# Patient Record
Sex: Male | Born: 1965 | Race: White | Hispanic: No | Marital: Married | Smoking: Never smoker
Health system: Southern US, Community
[De-identification: ages and names within clinical notes are randomized; demographics above are authoritative.]

## PROBLEM LIST (undated history)

## (undated) DIAGNOSIS — Z789 Other specified health status: Secondary | ICD-10-CM

## (undated) HISTORY — PX: SHOULDER SURGERY: SHX246

## (undated) HISTORY — PX: KNEE SURGERY: SHX244

---
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
## 2000-06-14 HISTORY — PX: KNEE ARTHROSCOPY: SHX127

## 2002-06-14 HISTORY — PX: SHOULDER ARTHROSCOPY: SHX128

## 2011-10-13 HISTORY — PX: SHOULDER ARTHROSCOPY: SHX128

## 2012-05-02 DIAGNOSIS — Z79899 Other long term (current) drug therapy: Secondary | ICD-10-CM | POA: Insufficient documentation

## 2012-05-02 DIAGNOSIS — B353 Tinea pedis: Secondary | ICD-10-CM | POA: Insufficient documentation

## 2012-05-02 DIAGNOSIS — D1739 Benign lipomatous neoplasm of skin and subcutaneous tissue of other sites: Secondary | ICD-10-CM | POA: Insufficient documentation

## 2012-05-02 DIAGNOSIS — L02619 Cutaneous abscess of unspecified foot: Secondary | ICD-10-CM | POA: Insufficient documentation

## 2012-05-02 NOTE — ED Notes (Signed)
C/o left foot pain since Sunday- denies specific injury

## 2012-05-03 ENCOUNTER — Ambulatory Visit (HOSPITAL_BASED_OUTPATIENT_CLINIC_OR_DEPARTMENT_OTHER)
Admit: 2012-05-03 | Discharge: 2012-05-03 | Disposition: A | Discharge status: Home / Self Care | Payer: BC Managed Care – PPO | Attending: Emergency Medicine | Admitting: Emergency Medicine

## 2012-05-03 ENCOUNTER — Emergency Department (HOSPITAL_BASED_OUTPATIENT_CLINIC_OR_DEPARTMENT_OTHER)
Admission: EM | Admit: 2012-05-03 | Discharge: 2012-05-03 | Disposition: A | Discharge status: Home / Self Care | Payer: BC Managed Care – PPO | Attending: Emergency Medicine | Admitting: Emergency Medicine

## 2012-05-03 ENCOUNTER — Encounter (HOSPITAL_BASED_OUTPATIENT_CLINIC_OR_DEPARTMENT_OTHER): Payer: Self-pay | Admitting: *Deleted

## 2012-05-03 ENCOUNTER — Emergency Department (HOSPITAL_BASED_OUTPATIENT_CLINIC_OR_DEPARTMENT_OTHER): Payer: BC Managed Care – PPO

## 2012-05-03 DIAGNOSIS — L0390 Cellulitis, unspecified: Secondary | ICD-10-CM

## 2012-05-03 DIAGNOSIS — B353 Tinea pedis: Secondary | ICD-10-CM

## 2012-05-03 DIAGNOSIS — D179 Benign lipomatous neoplasm, unspecified: Secondary | ICD-10-CM

## 2012-05-03 LAB — BASIC METABOLIC PANEL
BUN: 13 mg/dL (ref 6–23)
Chloride: 99 mEq/L (ref 96–112)
GFR calc non Af Amer: 65 mL/min — ABNORMAL LOW (ref >90)
Glucose, Bld: 160 mg/dL — ABNORMAL HIGH (ref 70–99)
Potassium: 3.8 mEq/L (ref 3.5–5.1)

## 2012-05-03 LAB — CBC WITH DIFFERENTIAL/PLATELET
Eosinophils Absolute: 0.3 10*3/uL (ref 0.0–0.7)
HCT: 40.8 % (ref 39.0–52.0)
Hemoglobin: 14.8 g/dL (ref 13.0–17.0)
Lymphs Abs: 2.3 10*3/uL (ref 0.7–4.0)
MCH: 31.2 pg (ref 26.0–34.0)
Monocytes Relative: 12 % (ref 3–12)
Neutrophils Relative %: 66 % (ref 43–77)
RBC: 4.74 MIL/uL (ref 4.22–5.81)

## 2012-05-03 MED ORDER — TRAMADOL HCL 50 MG PO TABS: 50.0000 mg | ORAL_TABLET | Freq: Four times a day (QID) | ORAL | Status: DC | PRN

## 2012-05-03 MED ORDER — DOXYCYCLINE HYCLATE 100 MG PO TABS
100.0000 mg | ORAL_TABLET | Freq: Once | ORAL | Status: AC
  Administered 2012-05-03: 100 mg via ORAL
  Filled 2012-05-03: qty 1

## 2012-05-03 MED ORDER — TETANUS-DIPHTH-ACELL PERTUSSIS 5-2.5-18.5 LF-MCG/0.5 IM SUSP
0.5000 mL | Freq: Once | INTRAMUSCULAR | Status: AC
  Administered 2012-05-03: 0.5 mL via INTRAMUSCULAR
  Filled 2012-05-03: qty 0.5

## 2012-05-03 MED ORDER — DOXYCYCLINE HYCLATE 100 MG PO CAPS: 100.0000 mg | ORAL_CAPSULE | Freq: Two times a day (BID) | ORAL | Status: DC

## 2012-05-03 MED ORDER — TRAMADOL HCL 50 MG PO TABS
50.0000 mg | ORAL_TABLET | Freq: Once | ORAL | Status: AC
  Administered 2012-05-03: 50 mg via ORAL
  Filled 2012-05-03: qty 1

## 2012-05-03 NOTE — ED Notes (Signed)
MD at bedside. 

## 2012-05-03 NOTE — ED Notes (Signed)
MD at bedside giving test results and plan of care. 

## 2012-05-03 NOTE — ED Notes (Signed)
Opened this chart to document test result from left lower extremity venous duplex ultrasound was negative. See detailed report in epic.Radiology report finalized given to NP Teressa Lower who states to call patient on his cell as pt requested at [phone] give report that there is no evidence of DVT in the left lower extremity. Spoke with John Haney who verbalizes understanding of the test results.

## 2012-05-03 NOTE — ED Provider Notes (Signed)
History     CSN: [id number]  Arrival date & time 05/02/12  2353   First MD Initiated Contact with Patient 05/03/12 0014      Chief Complaint  Patient presents with  . Foot Pain    (Consider location/radiation/quality/duration/timing/severity/associated sxs/prior treatment) Patient is a 46 y.o. male presenting with lower extremity pain and rash. The history is provided by the patient.  Foot Pain This is a new problem. The current episode started more than 2 days ago (4 days ago). The problem has been gradually worsening. Pertinent negatives include no chest pain, no abdominal pain, no headaches and no shortness of breath. Nothing aggravates the symptoms. Nothing relieves the symptoms. He has tried nothing for the symptoms.  Rash  This is a new problem. The current episode started more than 2 days ago. The problem has been gradually worsening. The problem is associated with nothing. There has been no fever. Affected Location: left foot. The pain is moderate. The pain has been constant since onset. Associated symptoms include pain. Pertinent negatives include no weeping. He has tried nothing for the symptoms. The treatment provided no relief.  Notes redness and swelling to the left foot.    No CP no SOB no DOE.  No f/c/r.   History reviewed. No pertinent past medical history.  Past Surgical History  Procedure Date  . Shoulder surgery   . Appendectomy   . Knee surgery     No family history on file.  History  Substance Use Topics  . Smoking status: Never Smoker   . Smokeless tobacco: Current User    Types: Chew  . Alcohol Use: Yes     Comment: RARE      Review of Systems  Respiratory: Negative for shortness of breath.   Cardiovascular: Negative for chest pain and palpitations.  Gastrointestinal: Negative for abdominal pain.  Skin: Positive for rash.  Neurological: Negative for headaches.  All other systems reviewed and are negative.    Allergies  Review of  patient's allergies indicates no known allergies.  Home Medications   Current Outpatient Rx  Name  Route  Sig  Dispense  Refill  . IBUPROFEN 200 MG PO TABS   Oral   Take 400 mg by mouth every 6 (six) hours as needed.         . OXYCODONE-ACETAMINOPHEN 5-325 MG PO TABS   Oral   Take 1 tablet by mouth every 4 (four) hours as needed.           BP 146/95  Pulse 120  Temp 99.3 F (37.4 C) (Oral)  Resp 18  Ht 5\' 10"  (1.778 m)  Wt 220 lb (99.791 kg)  BMI 31.57 kg/m2  SpO2 100%  Physical Exam  Constitutional: He is oriented to person, place, and time. He appears well-developed and well-nourished. No distress.  HENT:  Head: Normocephalic and atraumatic.  Mouth/Throat: Oropharynx is clear and moist.  Eyes: Conjunctivae normal are normal. Pupils are equal, round, and reactive to light.  Neck: Normal range of motion. Neck supple.  Cardiovascular: Normal rate and regular rhythm.   Pulmonary/Chest: Effort normal and breath sounds normal. No respiratory distress. He has no wheezes. He exhibits no tenderness.  Abdominal: Soft. Bowel sounds are normal. There is no tenderness.  Musculoskeletal: Normal range of motion.       Feet:       No swelling no tenderness no cords of the calf  Neurological: He is alert and oriented to person, place, and time. He  has normal reflexes.  Skin: Skin is warm and dry.  Psychiatric: He has a normal mood and affect.    ED Course  Procedures (including critical care time)  Labs Reviewed  CBC WITH DIFFERENTIAL - Abnormal; Notable for the following:    WBC 11.6 (*)     MCHC 36.3 (*)     Monocytes Absolute 1.3 (*)     All other components within normal limits  BASIC METABOLIC PANEL - Abnormal; Notable for the following:    Glucose, Bld 160 (*)     GFR calc non Af Amer 65 (*)     GFR calc Af Amer 75 (*)     All other components within normal limits  D-DIMER, QUANTITATIVE   No results found.   No diagnosis found.    MDM  Will treat as  cellulitis follow up for recheck in 2 days sooner for fever, streaking up the leg or any concerns.  Did drive to [place] last week will set up outpatient [place] to exclude clot. Patient verbalizes understanding and agrees to follow up       Sabrina Keough Smitty Cords, MD 05/03/12 [phone]

## 2012-05-03 NOTE — ED Notes (Signed)
Patient transported to X-ray 

## 2012-12-28 ENCOUNTER — Other Ambulatory Visit: Payer: Self-pay | Admitting: Orthopedic Surgery

## 2012-12-29 ENCOUNTER — Encounter (HOSPITAL_BASED_OUTPATIENT_CLINIC_OR_DEPARTMENT_OTHER): Payer: Self-pay | Admitting: *Deleted

## 2012-12-29 NOTE — Progress Notes (Signed)
Has had several sports related injuries-surgeries-

## 2013-01-01 ENCOUNTER — Other Ambulatory Visit: Payer: Self-pay | Admitting: Orthopedic Surgery

## 2013-01-02 NOTE — H&P (Signed)
John Haney is an 47 y.o. male.   Chief Complaint: Right Knee Pain  [id number] is seen for chondromalacia patella and loose bodies on MRI scan of the right knee.  He still has intermittent catching popping and pain in the knee.  Sometimes will do well for a number of hours or days and then it'll feel like it almost completely locks up.  He had similar symptoms in the mid 1990s and did well with an arthroscopic removal of torn cartilage and loose bodies.  He does report any true locking.  Anesthetic and cortisone injection provided about 1 hours worth of pain relief.  Past Medical History  Diagnosis Date  . Medical history non-contributory     Past Surgical History  Procedure Laterality Date  . Shoulder surgery    . Knee surgery    . Shoulder arthroscopy  05,13    right  . Shoulder arthroscopy  2004    left  . Knee arthroscopy  2002    right  . Appendectomy  1984    History reviewed. No pertinent family history. Social History:  reports that he has never smoked. His smokeless tobacco use includes Chew. He reports that  drinks alcohol. He reports that he does not use illicit drugs.  Allergies: No Known Allergies  No prescriptions prior to admission    No results found for this or any previous visit (from the past 48 hour(s)). No results found.  Review of Systems  Constitutional: Negative.   HENT: Negative.   Eyes: Negative.   Respiratory: Negative.   Cardiovascular: Negative.   Gastrointestinal: Negative.   Genitourinary: Negative.   Musculoskeletal: Positive for joint pain.  Skin: Negative.   Neurological: Negative.   Endo/Heme/Allergies: Negative.   Psychiatric/Behavioral: Negative.     Height 5\' 10"  (1.778 m), weight 99.791 kg (220 lb). Physical Exam  Constitutional: He is oriented to person, place, and time. He appears well-developed and well-nourished.  HENT:  Head: Normocephalic and atraumatic.  Eyes: EOM are normal. Pupils are equal, round, and reactive  to light.  Neck: Normal range of motion.  Cardiovascular: Intact distal pulses.   Respiratory: Effort normal and breath sounds normal.  Musculoskeletal: Normal range of motion. He exhibits tenderness (right knee pain).  Neurological: He is alert and oriented to person, place, and time.  Skin: Skin is warm and dry.  Psychiatric: He has a normal mood and affect. His behavior is normal. Judgment and thought content normal.     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  Plan: He has very similar symptoms to those that he had in the 1990s and would like to proceed with arthroscopic decompression since it is worked well in the past and is failed conservative treatment with exercises and cortisone injection that provided only temporary relief.  When the anesthetic was in force.  Nestor Lewandowsky 01/02/2013, 12:48 PM

## 2013-01-03 ENCOUNTER — Encounter (HOSPITAL_BASED_OUTPATIENT_CLINIC_OR_DEPARTMENT_OTHER)
Admission: RE | Disposition: A | Discharge status: Home / Self Care | Payer: Self-pay | Source: Ambulatory Visit | Attending: Orthopedic Surgery

## 2013-01-03 ENCOUNTER — Encounter (HOSPITAL_BASED_OUTPATIENT_CLINIC_OR_DEPARTMENT_OTHER): Payer: Self-pay | Admitting: Anesthesiology

## 2013-01-03 ENCOUNTER — Ambulatory Visit (HOSPITAL_BASED_OUTPATIENT_CLINIC_OR_DEPARTMENT_OTHER): Payer: BC Managed Care – PPO | Admitting: Anesthesiology

## 2013-01-03 ENCOUNTER — Encounter (HOSPITAL_BASED_OUTPATIENT_CLINIC_OR_DEPARTMENT_OTHER): Payer: Self-pay | Admitting: *Deleted

## 2013-01-03 ENCOUNTER — Ambulatory Visit (HOSPITAL_BASED_OUTPATIENT_CLINIC_OR_DEPARTMENT_OTHER)
Admission: RE | Admit: 2013-01-03 | Discharge: 2013-01-03 | Disposition: A | Discharge status: Home / Self Care | Payer: BC Managed Care – PPO | Source: Ambulatory Visit | Attending: Orthopedic Surgery | Admitting: Orthopedic Surgery

## 2013-01-03 DIAGNOSIS — M1120 Other chondrocalcinosis, unspecified site: Secondary | ICD-10-CM | POA: Insufficient documentation

## 2013-01-03 DIAGNOSIS — M94261 Chondromalacia, right knee: Secondary | ICD-10-CM

## 2013-01-03 DIAGNOSIS — M2340 Loose body in knee, unspecified knee: Secondary | ICD-10-CM | POA: Insufficient documentation

## 2013-01-03 DIAGNOSIS — F17200 Nicotine dependence, unspecified, uncomplicated: Secondary | ICD-10-CM | POA: Insufficient documentation

## 2013-01-03 DIAGNOSIS — M2240 Chondromalacia patellae, unspecified knee: Secondary | ICD-10-CM | POA: Insufficient documentation

## 2013-01-03 HISTORY — DX: Other specified health status: Z78.9

## 2013-01-03 HISTORY — PX: KNEE ARTHROSCOPY: SHX127

## 2013-01-03 SURGERY — ARTHROSCOPY, KNEE
Anesthesia: General | Site: Knee | Laterality: Right | Wound class: Clean

## 2013-01-03 MED ORDER — MIDAZOLAM HCL 5 MG/5ML IJ SOLN
INTRAMUSCULAR | Status: DC | PRN
  Administered 2013-01-03: 2 mg via INTRAVENOUS

## 2013-01-03 MED ORDER — OXYCODONE HCL 5 MG/5ML PO SOLN: 5.0000 mg | Freq: Once | ORAL | Status: DC | PRN

## 2013-01-03 MED ORDER — CHLORHEXIDINE GLUCONATE 4 % EX LIQD: 60.0000 mL | Freq: Once | CUTANEOUS | Status: DC

## 2013-01-03 MED ORDER — CEFAZOLIN SODIUM-DEXTROSE 2-3 GM-% IV SOLR
2.0000 g | INTRAVENOUS | Status: AC
  Administered 2013-01-03: 2 g via INTRAVENOUS

## 2013-01-03 MED ORDER — FENTANYL CITRATE 0.05 MG/ML IJ SOLN
INTRAMUSCULAR | Status: DC | PRN
  Administered 2013-01-03: 100 ug via INTRAVENOUS

## 2013-01-03 MED ORDER — PROPOFOL 10 MG/ML IV BOLUS
INTRAVENOUS | Status: DC | PRN
  Administered 2013-01-03: 200 mg via INTRAVENOUS

## 2013-01-03 MED ORDER — PROMETHAZINE HCL 25 MG/ML IJ SOLN: 6.2500 mg | INTRAMUSCULAR | Status: DC | PRN

## 2013-01-03 MED ORDER — HYDROCODONE-ACETAMINOPHEN 5-325 MG PO TABS: 1.0000 | ORAL_TABLET | Freq: Four times a day (QID) | ORAL | Status: DC | PRN

## 2013-01-03 MED ORDER — LACTATED RINGERS IV SOLN
INTRAVENOUS | Status: DC
  Administered 2013-01-03: 10:00:00 via INTRAVENOUS

## 2013-01-03 MED ORDER — SODIUM CHLORIDE 0.9 % IR SOLN
Status: DC | PRN
  Administered 2013-01-03: 12:00:00

## 2013-01-03 MED ORDER — BUPIVACAINE-EPINEPHRINE 0.5% -1:200000 IJ SOLN
INTRAMUSCULAR | Status: DC | PRN
  Administered 2013-01-03: 20 mL
  Administered 2013-01-03: 10 mL

## 2013-01-03 MED ORDER — LIDOCAINE HCL (CARDIAC) 20 MG/ML IV SOLN
INTRAVENOUS | Status: DC | PRN
  Administered 2013-01-03: 100 mg via INTRAVENOUS

## 2013-01-03 MED ORDER — ONDANSETRON HCL 4 MG/2ML IJ SOLN
INTRAMUSCULAR | Status: DC | PRN
  Administered 2013-01-03: 4 mg via INTRAVENOUS

## 2013-01-03 MED ORDER — DEXAMETHASONE SODIUM PHOSPHATE 4 MG/ML IJ SOLN
INTRAMUSCULAR | Status: DC | PRN
  Administered 2013-01-03: 10 mg via INTRAVENOUS

## 2013-01-03 MED ORDER — OXYCODONE HCL 5 MG PO TABS: 5.0000 mg | ORAL_TABLET | Freq: Once | ORAL | Status: DC | PRN

## 2013-01-03 MED ORDER — POVIDONE-IODINE 7.5 % EX SOLN: Freq: Once | CUTANEOUS | Status: DC

## 2013-01-03 MED ORDER — HYDROMORPHONE HCL PF 1 MG/ML IJ SOLN
0.2500 mg | INTRAMUSCULAR | Status: DC | PRN
  Administered 2013-01-03 (×2): 0.5 mg via INTRAVENOUS

## 2013-01-03 SURGICAL SUPPLY — 38 items
BANDAGE ELASTIC 6 VELCRO ST LF (GAUZE/BANDAGES/DRESSINGS) ×2 IMPLANT
BLADE 4.2CUDA (BLADE) IMPLANT
BLADE CUTTER GATOR 3.5 (BLADE) ×2 IMPLANT
BLADE GREAT WHITE 4.2 (BLADE) IMPLANT
CANISTER OMNI JUG 16 LITER (MISCELLANEOUS) ×2 IMPLANT
CANISTER SUCTION 2500CC (MISCELLANEOUS) IMPLANT
CHLORAPREP W/TINT 26ML (MISCELLANEOUS) ×2 IMPLANT
CLOTH BEACON ORANGE TIMEOUT ST (SAFETY) ×2 IMPLANT
DRAPE ARTHROSCOPY W/POUCH 114 (DRAPES) ×2 IMPLANT
ELECT MENISCUS 165MM 90D (ELECTRODE) IMPLANT
ELECT REM PT RETURN 9FT ADLT (ELECTROSURGICAL)
ELECTRODE REM PT RTRN 9FT ADLT (ELECTROSURGICAL) IMPLANT
GAUZE XEROFORM 1X8 LF (GAUZE/BANDAGES/DRESSINGS) ×2 IMPLANT
GLOVE BIO SURGEON STRL SZ7 (GLOVE) IMPLANT
GLOVE BIO SURGEON STRL SZ7.5 (GLOVE) ×2 IMPLANT
GLOVE BIOGEL PI IND STRL 7.0 (GLOVE) ×2 IMPLANT
GLOVE BIOGEL PI IND STRL 8 (GLOVE) ×1 IMPLANT
GLOVE BIOGEL PI INDICATOR 7.0 (GLOVE) ×2
GLOVE BIOGEL PI INDICATOR 8 (GLOVE) ×1
GLOVE ECLIPSE 6.5 STRL STRAW (GLOVE) ×2 IMPLANT
GOWN PREVENTION PLUS XLARGE (GOWN DISPOSABLE) ×4 IMPLANT
IV NS IRRIG 3000ML ARTHROMATIC (IV SOLUTION) ×2 IMPLANT
KNEE WRAP E Z 3 GEL PACK (MISCELLANEOUS) ×2 IMPLANT
NDL SAFETY ECLIPSE 18X1.5 (NEEDLE) ×1 IMPLANT
NEEDLE HYPO 18GX1.5 SHARP (NEEDLE) ×1
PACK ARTHROSCOPY DSU (CUSTOM PROCEDURE TRAY) ×2 IMPLANT
PACK BASIN DAY SURGERY FS (CUSTOM PROCEDURE TRAY) ×2 IMPLANT
PAD ALCOHOL SWAB (MISCELLANEOUS) ×2 IMPLANT
PENCIL BUTTON HOLSTER BLD 10FT (ELECTRODE) IMPLANT
SET ARTHROSCOPY TUBING (MISCELLANEOUS) ×1
SET ARTHROSCOPY TUBING LN (MISCELLANEOUS) ×1 IMPLANT
SLEEVE SCD COMPRESS KNEE MED (MISCELLANEOUS) IMPLANT
SPONGE GAUZE 4X4 12PLY (GAUZE/BANDAGES/DRESSINGS) ×2 IMPLANT
SYR 3ML 18GX1 1/2 (SYRINGE) IMPLANT
SYR 5ML LL (SYRINGE) ×2 IMPLANT
TOWEL OR 17X24 6PK STRL BLUE (TOWEL DISPOSABLE) ×2 IMPLANT
WAND STAR VAC 90 (SURGICAL WAND) IMPLANT
WATER STERILE IRR 1000ML POUR (IV SOLUTION) ×2 IMPLANT

## 2013-01-03 NOTE — Transfer of Care (Signed)
Immediate Anesthesia Transfer of Care Note  Patient: John Haney  Procedure(s) Performed: Procedure(s): ARTHROSCOPY KNEE, CHONDROPLASTY AND EXCISION OF LOOSE BODY (Right)  Patient Location: PACU  Anesthesia Type:General  Level of Consciousness: awake and alert   Airway & Oxygen Therapy: Patient Spontanous Breathing and Patient connected to face mask oxygen  Post-op Assessment: Report given to PACU RN and Post -op Vital signs reviewed and stable  Post vital signs: Reviewed and stable  Complications: No apparent anesthesia complications

## 2013-01-03 NOTE — Interval H&P Note (Signed)
History and Physical Interval Note:  01/03/2013 11:41 AM  John Haney  has presented today for surgery, with the diagnosis of chondromalacia patella, loose body  The various methods of treatment have been discussed with the patient and family. After consideration of risks, benefits and other options for treatment, the patient has consented to  Procedure(s): ARTHROSCOPY KNEE (Right) as a surgical intervention .  The patient's history has been reviewed, patient examined, no change in status, stable for surgery.  I have reviewed the patient's chart and labs.  Questions were answered to the patient's satisfaction.     Nestor Lewandowsky

## 2013-01-03 NOTE — Anesthesia Procedure Notes (Signed)
Procedure Name: LMA Insertion Date/Time: 01/03/2013 11:52 AM Performed by: Caren Macadam Pre-anesthesia Checklist: Patient identified, Emergency Drugs available, Suction available and Patient being monitored Patient Re-evaluated:Patient Re-evaluated prior to inductionOxygen Delivery Method: Circle System Utilized Preoxygenation: Pre-oxygenation with 100% oxygen Intubation Type: IV induction Ventilation: Mask ventilation without difficulty LMA: LMA inserted LMA Size: 5.0 Number of attempts: 1 Airway Equipment and Method: bite block Placement Confirmation: positive ETCO2 and breath sounds checked- equal and bilateral Tube secured with: Tape Dental Injury: Teeth and Oropharynx as per pre-operative assessment

## 2013-01-03 NOTE — Op Note (Signed)
Pre-Op Dx: Right knee loose bodies and chondromalacia  Postop Dx: Right knee chondral loose bodies, grade 3 chondromalacia lateral tibial plateau and apex of patella with an impressive chondrocalcinosis.   Procedure: Arthroscopic removal of loose bodies, debridement chondromalacia grade 3 to the apex of the patella and lateral tibial plateau with removal of extensive chondrocalcinosis  Surgeon: Feliberto Gottron. Turner Daniels M.D.  Assist: Cheryln Manly [employer] student Anes: General LMA  EBL: Minimal  Fluids: 800 cc   Indications: Patient has catching popping and pain in his right knee for many weeks appear x-rays are consistent with loose bodies in the joint.. Pt has failed conservative treatment with anti-inflammatory medicines, physical therapy, and modified activites but did get good temporarily from an intra-articular cortisone injection. Pain has recurred and patient desires elective arthroscopic evaluation and treatment of knee. Risks and benefits of surgery have been discussed and questions answered.  Procedure: Patient identified by arm band and taken to the operating room at the day [facility]. The appropriate anesthetic monitors were attached, and General LMA anesthesia was induced without difficulty. Lateral post was applied to the table and the lower extremity was prepped and draped in usual sterile fashion from the ankle to the midthigh. Time out procedure was performed. We began the operation by making standard inferior lateral and inferior medial peripatellar portals with a #11 blade allowing introduction of the arthroscope through the inferior lateral portal and the out flow to the inferior medial portal. Pump pressure was set at 100 mmHg and diagnostic arthroscopy  revealed extensive chondrocalcinosis of the patellofemoral joint medial and lateral compartments as well as the notch region. This was extensively debrided with a 3.5 mm Gator sucker shaver. Grade 3 chondromalacia was noted to the apex of  the patella and lateral tibial plateau region. While on the lateral side we identified removed 2 chondral loose bodies one of them a centimeter in diameter. The knee was irrigated out normal saline solution. A dressing of xerofoam 4 x 4 dressing sponges, web roll and an Ace wrap was applied. The patient was awakened extubated and taken to the recovery without difficulty.    Signed: Nestor Lewandowsky, MD

## 2013-01-03 NOTE — Anesthesia Preprocedure Evaluation (Signed)
Anesthesia Evaluation  Patient identified by MRN, date of birth, ID band Patient awake    Reviewed: Allergy & Precautions, H&P , NPO status , Patient's Chart, lab work & pertinent test results  History of Anesthesia Complications Negative for: history of anesthetic complications  Airway Mallampati: I  Neck ROM: Full    Dental  (+) Teeth Intact   Pulmonary neg pulmonary ROS,  breath sounds clear to auscultation        Cardiovascular negative cardio ROS  Rhythm:Regular Rate:Normal     Neuro/Psych negative neurological ROS     GI/Hepatic Neg liver ROS,   Endo/Other    Renal/GU negative Renal ROS     Musculoskeletal   Abdominal   Peds  Hematology   Anesthesia Other Findings   Reproductive/Obstetrics                           Anesthesia Physical Anesthesia Plan  ASA: I  Anesthesia Plan: General   Post-op Pain Management:    Induction: Intravenous  Airway Management Planned: LMA  Additional Equipment:   Intra-op Plan:   Post-operative Plan: Extubation in OR  Informed Consent: I have reviewed the patients History and Physical, chart, labs and discussed the procedure including the risks, benefits and alternatives for the proposed anesthesia with the patient or authorized representative who has indicated his/her understanding and acceptance.   Dental advisory given  Plan Discussed with: CRNA and Surgeon  Anesthesia Plan Comments:         Anesthesia Quick Evaluation

## 2013-01-03 NOTE — Anesthesia Postprocedure Evaluation (Signed)
  Anesthesia Post-op Note  Patient: John Haney  Procedure(s) Performed: Procedure(s): ARTHROSCOPY KNEE, CHONDROPLASTY AND EXCISION OF LOOSE BODY (Right)  Patient Location: PACU  Anesthesia Type:General  Level of Consciousness: awake and alert   Airway and Oxygen Therapy: Patient Spontanous Breathing  Post-op Pain: mild  Post-op Assessment: Post-op Vital signs reviewed  Post-op Vital Signs: stable  Complications: No apparent anesthesia complications

## 2013-01-04 ENCOUNTER — Encounter (HOSPITAL_BASED_OUTPATIENT_CLINIC_OR_DEPARTMENT_OTHER): Payer: Self-pay | Admitting: Orthopedic Surgery

## 2014-05-27 ENCOUNTER — Emergency Department (HOSPITAL_BASED_OUTPATIENT_CLINIC_OR_DEPARTMENT_OTHER): Payer: No Typology Code available for payment source

## 2014-05-27 ENCOUNTER — Emergency Department (HOSPITAL_BASED_OUTPATIENT_CLINIC_OR_DEPARTMENT_OTHER)
Admission: EM | Admit: 2014-05-27 | Discharge: 2014-05-27 | Disposition: A | Discharge status: Home / Self Care | Payer: No Typology Code available for payment source | Attending: Emergency Medicine | Admitting: Emergency Medicine

## 2014-05-27 ENCOUNTER — Encounter (HOSPITAL_BASED_OUTPATIENT_CLINIC_OR_DEPARTMENT_OTHER): Payer: Self-pay

## 2014-05-27 DIAGNOSIS — M79672 Pain in left foot: Secondary | ICD-10-CM | POA: Insufficient documentation

## 2014-05-27 LAB — URIC ACID: URIC ACID, SERUM: 9.7 mg/dL — AB (ref 4.0–7.8)

## 2014-05-27 MED ORDER — INDOMETHACIN 25 MG PO CAPS: 25.0000 mg | ORAL_CAPSULE | Freq: Three times a day (TID) | ORAL | Status: AC

## 2014-05-27 MED ORDER — HYDROCODONE-ACETAMINOPHEN 5-325 MG PO TABS: 1.0000 | ORAL_TABLET | Freq: Four times a day (QID) | ORAL | Status: DC | PRN

## 2014-05-27 NOTE — Discharge Instructions (Signed)
You may call back for your uric acid level as it has not resulted yet. Please follow up with your primary care physician in 1-2 days. If you do not have one please call the [facility] number listed above. Please take pain medication and/or muscle relaxants as prescribed and as needed for pain. Please do not drive on narcotic pain medication or on muscle relaxants. Please read all discharge instructions and return precautions.   Gout Gout is an inflammatory arthritis caused by a buildup of uric acid crystals in the joints. Uric acid is a chemical that is normally present in the blood. When the level of uric acid in the blood is too high it can form crystals that deposit in your joints and tissues. This causes joint redness, soreness, and swelling (inflammation). Repeat attacks are common. Over time, uric acid crystals can form into masses (tophi) near a joint, destroying bone and causing disfigurement. Gout is treatable and often preventable. CAUSES  The disease begins with elevated levels of uric acid in the blood. Uric acid is produced by your body when it breaks down a naturally found substance called purines. Certain foods you eat, such as meats and fish, contain high amounts of purines. Causes of an elevated uric acid level include:  Being passed down from parent to child (heredity).  Diseases that cause increased uric acid production (such as obesity, psoriasis, and certain cancers).  Excessive alcohol use.  Diet, especially diets rich in meat and seafood.  Medicines, including certain cancer-fighting medicines (chemotherapy), water pills (diuretics), and aspirin.  Chronic kidney disease. The kidneys are no longer able to remove uric acid well.  Problems with metabolism. Conditions strongly associated with gout include:  Obesity.  High blood pressure.  High cholesterol.  Diabetes. Not everyone with elevated uric acid levels gets gout. It is not understood why  some people get gout and others do not. Surgery, joint injury, and eating too much of certain foods are some of the factors that can lead to gout attacks. SYMPTOMS   An attack of gout comes on quickly. It causes intense pain with redness, swelling, and warmth in a joint.  Fever can occur.  Often, only one joint is involved. Certain joints are more commonly involved:  Base of the big toe.  Knee.  Ankle.  Wrist.  Finger. Without treatment, an attack usually goes away in a few days to weeks. Between attacks, you usually will not have symptoms, which is different from many other forms of arthritis. DIAGNOSIS  Your caregiver will suspect gout based on your symptoms and exam. In some cases, tests may be recommended. The tests may include:  Blood tests.  Urine tests.  X-rays.  Joint fluid exam. This exam requires a needle to remove fluid from the joint (arthrocentesis). Using a microscope, gout is confirmed when uric acid crystals are seen in the joint fluid. TREATMENT  There are two phases to gout treatment: treating the sudden onset (acute) attack and preventing attacks (prophylaxis).  Treatment of an Acute Attack.  Medicines are used. These include anti-inflammatory medicines or steroid medicines.  An injection of steroid medicine into the affected joint is sometimes necessary.  The painful joint is rested. Movement can worsen the arthritis.  You may use warm or cold treatments on painful joints, depending which works best for you.  Treatment to Prevent Attacks.  If you suffer from frequent gout attacks, your caregiver may advise preventive medicine. These medicines are started after the acute attack  subsides. These medicines either help your kidneys eliminate uric acid from your body or decrease your uric acid production. You may need to stay on these medicines for a very long time.  The early phase of treatment with preventive medicine can be associated with an increase in  acute gout attacks. For this reason, during the first few months of treatment, your caregiver may also advise you to take medicines usually used for acute gout treatment. Be sure you understand your caregiver's directions. Your caregiver may make several adjustments to your medicine dose before these medicines are effective.  Discuss dietary treatment with your caregiver or dietitian. Alcohol and drinks high in sugar and fructose and foods such as meat, poultry, and seafood can increase uric acid levels. Your caregiver or dietitian can advise you on drinks and foods that should be limited. HOME CARE INSTRUCTIONS   Do not take aspirin to relieve pain. This raises uric acid levels.  Only take over-the-counter or prescription medicines for pain, discomfort, or fever as directed by your caregiver.  Rest the joint as much as possible. When in bed, keep sheets and blankets off painful areas.  Keep the affected joint raised (elevated).  Apply warm or cold treatments to painful joints. Use of warm or cold treatments depends on which works best for you.  Use crutches if the painful joint is in your leg.  Drink enough fluids to keep your urine clear or pale yellow. This helps your body get rid of uric acid. Limit alcohol, sugary drinks, and fructose drinks.  Follow your dietary instructions. Pay careful attention to the amount of protein you eat. Your daily diet should emphasize fruits, vegetables, whole grains, and fat-free or low-fat milk products. Discuss the use of coffee, vitamin C, and cherries with your caregiver or dietitian. These may be helpful in lowering uric acid levels.  Maintain a healthy body weight. SEEK MEDICAL CARE IF:   You develop diarrhea, vomiting, or any side effects from medicines.  You do not feel better in 24 hours, or you are getting worse. SEEK IMMEDIATE MEDICAL CARE IF:   Your joint becomes suddenly more tender, and you have chills or a fever. MAKE SURE YOU:    Understand these instructions.  Will watch your condition.  Will get help right away if you are not doing well or get worse. Document Released: 05/28/2000 Document Revised: 10/15/2013 Document Reviewed: 01/12/2012 [facility] Patient Information 2015 [place], [place]. This information is not intended to replace advice given to you by your health care provider. Make sure you discuss any questions you have with your health care provider.

## 2014-05-27 NOTE — ED Provider Notes (Signed)
CSN: [id number]     Arrival date & time 05/27/14  1221 History   First MD Initiated Contact with Patient 05/27/14 1310     Chief Complaint  Patient presents with  . Foot Pain     (Consider location/radiation/quality/duration/timing/severity/associated sxs/prior Treatment) HPI Comments: Patient is a 48 yo M presenting to the ED for left foot pain and redness that started two days. He states his pain has been gradually worsening since then. He noticed the redness yesterday. Patient endorses having flare ups for these symptoms in the past, but no one has ever told him if he has gout or not. He does notice his symptoms typically start after having very salty foods. He has tried Naproxen with no improvement. No modifying factors identified. Denies any fevers, chills, numbness or weakness.   Past Medical History  Diagnosis Date  . Medical history non-contributory    Past Surgical History  Procedure Laterality Date  . Shoulder surgery    . Knee surgery    . Shoulder arthroscopy  05,13    right  . Shoulder arthroscopy  2004    left  . Knee arthroscopy  2002    right  . Appendectomy  1984  . Knee arthroscopy Right 01/03/2013    Procedure: ARTHROSCOPY KNEE, CHONDROPLASTY AND EXCISION OF LOOSE BODY;  Surgeon: Kerin Salen, MD;  Location: [facility];  Service: Orthopedics;  Laterality: Right;   No family history on file. History  Substance Use Topics  . Smoking status: Never Smoker   . Smokeless tobacco: Current User    Types: Chew  . Alcohol Use: Yes     Comment: RARE    Review of Systems  Musculoskeletal: Positive for myalgias and arthralgias.  All other systems reviewed and are negative.     Allergies  Review of patient's allergies indicates no known allergies.  Home Medications   Prior to Admission medications   Medication Sig Start Date End Date Taking? Authorizing Provider  HYDROcodone-acetaminophen (NORCO) 5-325 MG per tablet Take 1 tablet by mouth  every 6 (six) hours as needed for pain. 01/03/13   Kerin Salen, MD  ibuprofen (ADVIL,MOTRIN) 200 MG tablet Take 400 mg by mouth every 6 (six) hours as needed.    Historical Provider, MD   BP 150/97 mmHg  Pulse 82  Temp(Src) 97.9 F (36.6 C) (Oral)  Resp 18  Ht 5\' 10"  (1.778 m)  Wt 225 lb (102.059 kg)  BMI 32.28 kg/m2  SpO2 99% Physical Exam  Constitutional: He is oriented to person, place, and time. He appears well-developed and well-nourished. No distress.  HENT:  Head: Normocephalic and atraumatic.  Right Ear: External ear normal.  Left Ear: External ear normal.  Nose: Nose normal.  Mouth/Throat: Oropharynx is clear and moist.  Eyes: Conjunctivae are normal.  Neck: Normal range of motion. Neck supple.  Cardiovascular: Normal rate, regular rhythm, normal heart sounds and intact distal pulses.   Pulmonary/Chest: Effort normal and breath sounds normal. No respiratory distress.  Abdominal: Soft.  Musculoskeletal: Normal range of motion.       Right ankle: Normal.       Left ankle: Normal.       Right foot: Normal.       Left foot: There is tenderness. There is normal range of motion, no swelling, normal capillary refill, no crepitus, no deformity and no laceration.       Feet:  Neurological: He is alert and oriented to person, place, and time.  Skin:  Skin is warm and dry. He is not diaphoretic.  Psychiatric: He has a normal mood and affect.  Nursing note and vitals reviewed.   ED Course  Procedures (including critical care time) Medications - No data to display  Labs Review Labs Reviewed  URIC ACID    Imaging Review Dg Foot Complete Left  05/27/2014   CLINICAL DATA:  Left foot swelling and redness.  EXAM: LEFT FOOT - COMPLETE 3+ VIEW  COMPARISON:  None.  FINDINGS: There is no evidence of fracture or dislocation. 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 Soft tissues are unremarkable.  IMPRESSION: No acute osseous injury of the left foot.   Electronically  Signed   By: Kathreen Devoid   On: 05/27/2014 14:06     EKG Interpretation None      Patient requesting to be discharged prior to uric acid result. Advised him he could call for results.  MDM   Final diagnoses:  Left foot pain    Filed Vitals:   05/27/14 1228  BP: 150/97  Pulse:   Temp:   Resp:    Afebrile, NAD, non-toxic appearing, AAOx4. I have reviewed nursing notes, vital signs, and all appropriate lab and imaging results for this patient. Neurovascularly intact. Normal sensation. No evidence of compartment syndrome. No evidence of fracture or other acute bony abnormality. Will treat for gout. Uric acid elevated will treat with indomethacin and pain medications. Advised PCP f/u. Return precautions discussed. Patient is agreeable to plan. Patient is stable at time of discharge      Harlow Mares, PA-C 05/27/14 [place], MD 05/28/14 [phone]

## 2014-05-27 NOTE — ED Notes (Signed)
Pt reports left foot pain and swelling that started 2 days ago.  Pt reports it happens every now adn then and they have never determined if it was gout or arthritis.  Pt reports pain with walking.

## 2014-08-18 ENCOUNTER — Emergency Department (HOSPITAL_COMMUNITY): Payer: BLUE CROSS/BLUE SHIELD

## 2014-08-18 ENCOUNTER — Emergency Department (HOSPITAL_COMMUNITY)
Admission: EM | Admit: 2014-08-18 | Discharge: 2014-08-18 | Disposition: A | Discharge status: Home / Self Care | Payer: BLUE CROSS/BLUE SHIELD | Attending: Emergency Medicine | Admitting: Emergency Medicine

## 2014-08-18 ENCOUNTER — Encounter (HOSPITAL_COMMUNITY): Payer: Self-pay | Admitting: *Deleted

## 2014-08-18 DIAGNOSIS — Y9372 Activity, wrestling: Secondary | ICD-10-CM | POA: Diagnosis not present

## 2014-08-18 DIAGNOSIS — S4991XA Unspecified injury of right shoulder and upper arm, initial encounter: Secondary | ICD-10-CM | POA: Diagnosis not present

## 2014-08-18 DIAGNOSIS — Y9289 Other specified places as the place of occurrence of the external cause: Secondary | ICD-10-CM | POA: Insufficient documentation

## 2014-08-18 DIAGNOSIS — T1490XA Injury, unspecified, initial encounter: Secondary | ICD-10-CM

## 2014-08-18 DIAGNOSIS — Y998 Other external cause status: Secondary | ICD-10-CM | POA: Diagnosis not present

## 2014-08-18 DIAGNOSIS — X58XXXA Exposure to other specified factors, initial encounter: Secondary | ICD-10-CM | POA: Insufficient documentation

## 2014-08-18 DIAGNOSIS — S59901A Unspecified injury of right elbow, initial encounter: Secondary | ICD-10-CM | POA: Diagnosis not present

## 2014-08-18 MED ORDER — METHOCARBAMOL 500 MG PO TABS: 500.0000 mg | ORAL_TABLET | Freq: Two times a day (BID) | ORAL | Status: DC

## 2014-08-18 MED ORDER — IBUPROFEN 800 MG PO TABS: 800.0000 mg | ORAL_TABLET | Freq: Three times a day (TID) | ORAL | Status: DC | PRN

## 2014-08-18 NOTE — ED Notes (Signed)
Pt reports wrestling on Thursday and felt something pop to right arm, has pain to elbow and into his shoulder.

## 2014-08-18 NOTE — ED Provider Notes (Signed)
CSN: [id number]     Arrival date & time 08/18/14  0828 History   First MD Initiated Contact with Patient 08/18/14 [phone]     Chief Complaint  Patient presents with  . Arm Injury     (Consider location/radiation/quality/duration/timing/severity/associated sxs/prior Treatment) HPI   49 year old male presents for evaluation of right elbow injury. 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  Sts he immediately stop wrestling and applied ice to affected area.  He has been taking aleve for pain with some improvement.  Report a pulling sensation to the anterior aspect of R shoulder with certain movement, as well as pain to the medial aspect of R elbow with supination.  Normal grip strength, no bruising or swelling.  Pt has multiple surgeries to R shoulder in the past.  He f/u with [facility].  Denies numbness or weakness.  No neck pain.   Past Medical History  Diagnosis Date  . Medical history non-contributory    Past Surgical History  Procedure Laterality Date  . Shoulder surgery    . Knee surgery    . Shoulder arthroscopy  05,13    right  . Shoulder arthroscopy  2004    left  . Knee arthroscopy  2002    right  . Appendectomy  1984  . Knee arthroscopy Right 01/03/2013    Procedure: ARTHROSCOPY KNEE, CHONDROPLASTY AND EXCISION OF LOOSE BODY;  Surgeon: Kerin Salen, MD;  Location: [facility];  Service: Orthopedics;  Laterality: Right;   History reviewed. No pertinent family history. History  Substance Use Topics  . Smoking status: Never Smoker   . Smokeless tobacco: Current User    Types: Chew  . Alcohol Use: Yes     Comment: RARE    Review of Systems  Constitutional: Negative for fever.  Musculoskeletal: Positive for arthralgias.  Skin: Negative for rash and wound.  Neurological: Negative for numbness.       Allergies  Review of patient's allergies indicates no known allergies.  Home Medications   Prior to Admission medications   Medication Sig Start Date End Date Taking? Authorizing Provider  HYDROcodone-acetaminophen (NORCO) 5-325 MG per tablet Take 1 tablet by mouth every 6 (six) hours as needed for pain. 01/03/13   Kerin Salen, MD  HYDROcodone-acetaminophen (NORCO/VICODIN) 5-325 MG per tablet Take 1-2 tablets by mouth every 6 (six) hours as needed for severe pain. 05/27/14   Jennifer L Piepenbrink, PA-C  ibuprofen (ADVIL,MOTRIN) 200 MG tablet Take 400 mg by mouth every 6 (six) hours as needed.    Historical Provider, MD  indomethacin (INDOCIN) 25 MG capsule Take 1 capsule (25 mg total) by mouth 3 (three) times daily with meals. May take up to 50mg  three times a day if no improvement with 25mg . 05/27/14   Jennifer L Piepenbrink, PA-C   BP 142/88 mmHg  Pulse 100  Temp(Src) 97.8 F (36.6 C) (Oral)  Resp 20  SpO2 99% Physical Exam  Constitutional: He appears well-developed and well-nourished. No distress.  HENT:  Head: Atraumatic.  Eyes: Conjunctivae are normal.  Neck: Normal range of motion. Neck supple.  Musculoskeletal: He exhibits tenderness (R shoulder: tenderness to [facility] joint with FROM, no gross deformity and no overlying skin changes.  R elbow: point tenederness to medial epicondyle, pain with supination.  FROM.).  Radial pulses intact with normal grip  strength bilaterally.   Neurological: He is alert.  Skin: No rash noted.  Psychiatric: He has a normal mood and affect.    ED Course  Procedures (including critical care time)  8:50 AM Injury to R elbow, suspect tendon/ligament injury.  Xray ordered.   10:02 AM X-rays show no acute fractures or dislocation. Suspect soft tissue injury. A sling provided for comfort. Ice therapy discussed. Patient will follow-up closely with his orthopedic specialist, Dr. Mayer Camel for outpatient management. He may benefit from MRI if  symptoms persist. Otherwise he is neurovascularly intact.  Labs Review Labs Reviewed - No data to display  Imaging Review Dg Elbow Complete Right  08/18/2014   CLINICAL DATA:  49 year old male with a history of right elbow injury, sports injury.  EXAM: RIGHT ELBOW - COMPLETE 3+ VIEW  COMPARISON:  None.  FINDINGS: No acute bony abnormality. No significant soft tissue swelling. No evidence of joint effusion.  No radiopaque foreign body.  No significant degenerative changes.  IMPRESSION: No acute bony abnormality identified. If there is ongoing concern for occult bony abnormality, repeat plain film in 10-14 days may be useful. Alternatively, if there is concern for soft tissue injury, referral for evaluation and potential MRI may be indicated.  Signed,  Dulcy Fanny. Earleen Newport, DO  [facility]  [facility]   Electronically Signed   By: Corrie Mckusick D.O.   On: 08/18/2014 09:55     EKG Interpretation None      MDM   Final diagnoses:  Injury  Elbow injury, right, initial encounter    BP 142/88 mmHg  Pulse 100  Temp(Src) 97.8 F (36.6 C) (Oral)  Resp 20  SpO2 99%  I have reviewed nursing notes and vital signs. I personally reviewed the imaging tests through PACS system  I reviewed available ER/hospitalization records thought the EMR     Domenic Moras, PA-C 08/18/14 [place], MD 08/21/14 2220

## 2014-08-18 NOTE — Discharge Instructions (Signed)
RICE: Routine Care for Injuries The routine care of many injuries includes Rest, Ice, Compression, and Elevation (RICE). HOME CARE INSTRUCTIONS  Rest is needed to allow your body to heal. Routine activities can usually be resumed when comfortable. Injured tendons and bones can take up to 6 weeks to heal. Tendons are the cord-like structures that attach muscle to bone.  Ice following an injury helps keep the swelling down and reduces pain.  Put ice in a plastic bag.  Place a towel between your skin and the bag.  Leave the ice on for 15-20 minutes, 3-4 times a day, or as directed by your health care provider. Do this while awake, for the first 24 to 48 hours. After that, continue as directed by your caregiver.  Compression helps keep swelling down. It also gives support and helps with discomfort. If an elastic bandage has been applied, it should be removed and reapplied every 3 to 4 hours. It should not be applied tightly, but firmly enough to keep swelling down. Watch fingers or toes for swelling, bluish discoloration, coldness, numbness, or excessive pain. If any of these problems occur, remove the bandage and reapply loosely. Contact your caregiver if these problems continue.  Elevation helps reduce swelling and decreases pain. With extremities, such as the arms, hands, legs, and feet, the injured area should be placed near or above the level of the heart, if possible. SEEK IMMEDIATE MEDICAL CARE IF:  You have persistent pain and swelling.  You develop redness, numbness, or unexpected weakness.  Your symptoms are getting worse rather than improving after several days. These symptoms may indicate that further evaluation or further X-rays are needed. Sometimes, X-rays may not show a small broken bone (fracture) until 1 week or 10 days later. Make a follow-up appointment with your caregiver. Ask when your X-ray results will be ready. Make sure you get your X-ray results. Document Released:  09/12/2000 Document Revised: 06/05/2013 Document Reviewed: 10/30/2010 Exit[employer] Patient Information 2015 [employer]. This information is not intended to replace advice given to you by your health care provider. Make sure you discuss any questions you have with your health care provider.  

## 2014-08-18 NOTE — ED Notes (Signed)
Declined W/C at D/C and was escorted to lobby by RN. 

## 2015-03-13 ENCOUNTER — Emergency Department (HOSPITAL_COMMUNITY): Payer: BLUE CROSS/BLUE SHIELD

## 2015-03-13 ENCOUNTER — Emergency Department (HOSPITAL_COMMUNITY)
Admission: EM | Admit: 2015-03-13 | Discharge: 2015-03-13 | Disposition: A | Discharge status: Home / Self Care | Payer: BLUE CROSS/BLUE SHIELD | Attending: Emergency Medicine | Admitting: Emergency Medicine

## 2015-03-13 ENCOUNTER — Emergency Department (HOSPITAL_COMMUNITY): Payer: Self-pay

## 2015-03-13 ENCOUNTER — Encounter (HOSPITAL_COMMUNITY): Payer: Self-pay | Admitting: Emergency Medicine

## 2015-03-13 DIAGNOSIS — Y998 Other external cause status: Secondary | ICD-10-CM | POA: Insufficient documentation

## 2015-03-13 DIAGNOSIS — Z79899 Other long term (current) drug therapy: Secondary | ICD-10-CM | POA: Insufficient documentation

## 2015-03-13 DIAGNOSIS — Y9389 Activity, other specified: Secondary | ICD-10-CM | POA: Insufficient documentation

## 2015-03-13 DIAGNOSIS — Y9289 Other specified places as the place of occurrence of the external cause: Secondary | ICD-10-CM | POA: Insufficient documentation

## 2015-03-13 DIAGNOSIS — X58XXXA Exposure to other specified factors, initial encounter: Secondary | ICD-10-CM | POA: Insufficient documentation

## 2015-03-13 DIAGNOSIS — S93401A Sprain of unspecified ligament of right ankle, initial encounter: Secondary | ICD-10-CM | POA: Insufficient documentation

## 2015-03-13 MED ORDER — IBUPROFEN 800 MG PO TABS: 800.0000 mg | ORAL_TABLET | Freq: Three times a day (TID) | ORAL | Status: AC

## 2015-03-13 MED ORDER — IBUPROFEN 400 MG PO TABS
600.0000 mg | ORAL_TABLET | Freq: Once | ORAL | Status: AC
  Administered 2015-03-13: 600 mg via ORAL
  Filled 2015-03-13: qty 2

## 2015-03-13 NOTE — Discharge Instructions (Signed)

## 2015-03-13 NOTE — ED Notes (Signed)
Pt states he "rolled" his right ankle 2 days ago. No deformity. States has been icing and elevating. Has ASO that "worked the best". 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

## 2015-03-13 NOTE — ED Provider Notes (Signed)
CSN: [id number]     Arrival date & time 03/13/15  9485 History  By signing my name below, I, John Haney, attest that this documentation has been prepared under the direction and in the presence of [employer], PA-C. Electronically Signed: Judithann Haney, ED Scribe. 03/13/2015. 10:50 AM.   Chief Complaint  Patient presents with  . Ankle Injury   The history is provided by the patient. No language interpreter was used.   HPI Comments: John Haney is a 49 y.o. male who presents to the Emergency Department status post ankle injury that occurred 2 days ago. He reports associated gradually worsening right ankle pain and swelling after putting on a boot. He explains that he was walking with his daughter when a brick came out causing him to invert his ankle. He states that he has been using ice and keeping his foot elevated with no relief. He reports that he has sprained this ankle before but never broken it. Patient denies any pain to the knee or hip.  Past Medical History  Diagnosis Date  . Medical history non-contributory    Past Surgical History  Procedure Laterality Date  . Shoulder surgery    . Knee surgery    . Shoulder arthroscopy  05,13    right  . Shoulder arthroscopy  2004    left  . Knee arthroscopy  2002    right  . Appendectomy  1984  . Knee arthroscopy Right 01/03/2013    Procedure: ARTHROSCOPY KNEE, CHONDROPLASTY AND EXCISION OF LOOSE BODY;  Surgeon: John Salen, MD;  Location: [facility];  Service: Orthopedics;  Laterality: Right;   No family history on file. Social History  Substance Use Topics  . Smoking status: Never Smoker   . Smokeless tobacco: Current User    Types: Chew  . Alcohol Use: Yes     Comment: RARE    Review of Systems  Constitutional: Negative for fever.  Musculoskeletal: Positive for joint swelling and arthralgias.  All other systems reviewed and are negative.     Allergies  Review of patient's allergies  indicates no known allergies.  Home Medications   Prior to Admission medications   Medication Sig Start Date End Date Taking? Authorizing Provider  HYDROcodone-acetaminophen (NORCO) 5-325 MG per tablet Take 1 tablet by mouth every 6 (six) hours as needed for pain. 01/03/13   John Pear, MD  HYDROcodone-acetaminophen (NORCO/VICODIN) 5-325 MG per tablet Take 1-2 tablets by mouth every 6 (six) hours as needed for severe pain. 05/27/14   John Piepenbrink, PA-C  ibuprofen (ADVIL,MOTRIN) 800 MG tablet Take 1 tablet (800 mg total) by mouth 3 (three) times daily. 03/13/15   John Regal, PA-C  indomethacin (INDOCIN) 25 MG capsule Take 1 capsule (25 mg total) by mouth 3 (three) times daily with meals. May take up to 50mg  three times a day if no improvement with 25mg . 05/27/14   John Sane, PA-C  methocarbamol (ROBAXIN) 500 MG tablet Take 1 tablet (500 mg total) by mouth 2 (two) times daily. 08/18/14   John Moras, PA-C   BP 138/88 mmHg  Pulse 91  Temp(Src) 98.2 F (36.8 C) (Oral)  Resp 16  Ht 5\' 10"  (1.778 m)  Wt 220 lb (99.791 kg)  BMI 31.57 kg/m2  SpO2 98% Physical Exam  Constitutional: He is oriented to person, place, and time. He appears well-developed and well-nourished. No distress.  HENT:  Head: Normocephalic and atraumatic.  Eyes: Conjunctivae and EOM are normal.  Neck: Neck supple. No  tracheal deviation present.  Cardiovascular: Normal rate.   Pulmonary/Chest: Effort normal. No respiratory distress.  Musculoskeletal: Normal range of motion. He exhibits edema and tenderness.  Obvious swelling to lateral aspect to right ankle TTP to lateral malleolous on proximal foot Difficult exam with effort Unable to assess laxity Unable to bear weight Non tender to remainder of extremity and proximal fibia non tender  Neurological: He is alert and oriented to person, place, and time.  Skin: Skin is warm and dry.  Psychiatric: He has a normal mood and affect. His behavior is  normal.  Nursing note and vitals reviewed.   ED Course  Procedures (including critical care time) DIAGNOSTIC STUDIES: Oxygen Saturation is 100% on RA, normal by my interpretation.    COORDINATION OF CARE: 10:48 AM- Pt advised of plan for treatment and pt agrees.    Imaging Review Dg Ankle Complete Right  03/13/2015   CLINICAL DATA:  Pain following twisting injury 2 days prior  EXAM: RIGHT ANKLE - COMPLETE 3+ VIEW  COMPARISON:  None.  FINDINGS: Frontal, oblique, and lateral views were obtained. There is soft tissue swelling laterally with joint effusion. No fracture is apparent. The ankle mortise appears intact. There is joint space narrowing medially. There is a minimal spur arising from the posterior calcaneus.  IMPRESSION: Soft tissue swelling with joint effusion. Question a degree of underlying ligamentous injury. No fracture apparent. Ankle mortise appears intact. Narrowing in the medial aspect of the joint.   Electronically Signed   By: John Haney M.D.   On: 03/13/2015 10:18   Dg Foot Complete Right  03/13/2015   CLINICAL DATA:  Rolled ankle 2 days ago.  Ankle pain  EXAM: RIGHT FOOT COMPLETE - 3+ VIEW  COMPARISON:  None.  FINDINGS: Negative for fracture. Normal alignment. Mild degenerative change in the first MTP joint.  IMPRESSION: Negative for fracture.   Electronically Signed   By: John Gallo M.D.   On: 03/13/2015 11:25   John Haney has personally reviewed and evaluated these images as part of his medical decision-making.   EKG Interpretation None      MDM   Final diagnoses:  Sprained ankle, right, initial encounter  Labs:  Imaging: DG foot, DG ankle Cipro  Consults:  Therapeutics: Ibuprofen  Discharge Meds: Ibuprofen, Tylenol, ice  Assessment/Plan: Patient presents with a right ankle sprain. Due to patient's pain difficult ankle exam, unable to assess laxity at this time. Patient will be encouraged to continue using brace, crutches, follow-up  with orthopedic surgeon for further evaluation and management. Ice, ibuprofen, Tylenol as needed for pain. She verbalizes understanding and agreement for today's plan.    I personally performed the services described in this documentation, which was scribed in my presence. The recorded information has been reviewed and is accurate.   John Regal, PA-C 03/13/15 1651  Orpah Greek, MD 03/14/15 [phone]

## 2016-01-08 IMAGING — CR DG FOOT COMPLETE 3+V*L*
3 series · 3 of 3 positions shown · non-contrast
Comparison: None.

CLINICAL DATA: Left foot swelling and redness.

EXAM:
LEFT FOOT - COMPLETE 3+ VIEW

[t foot ap left]
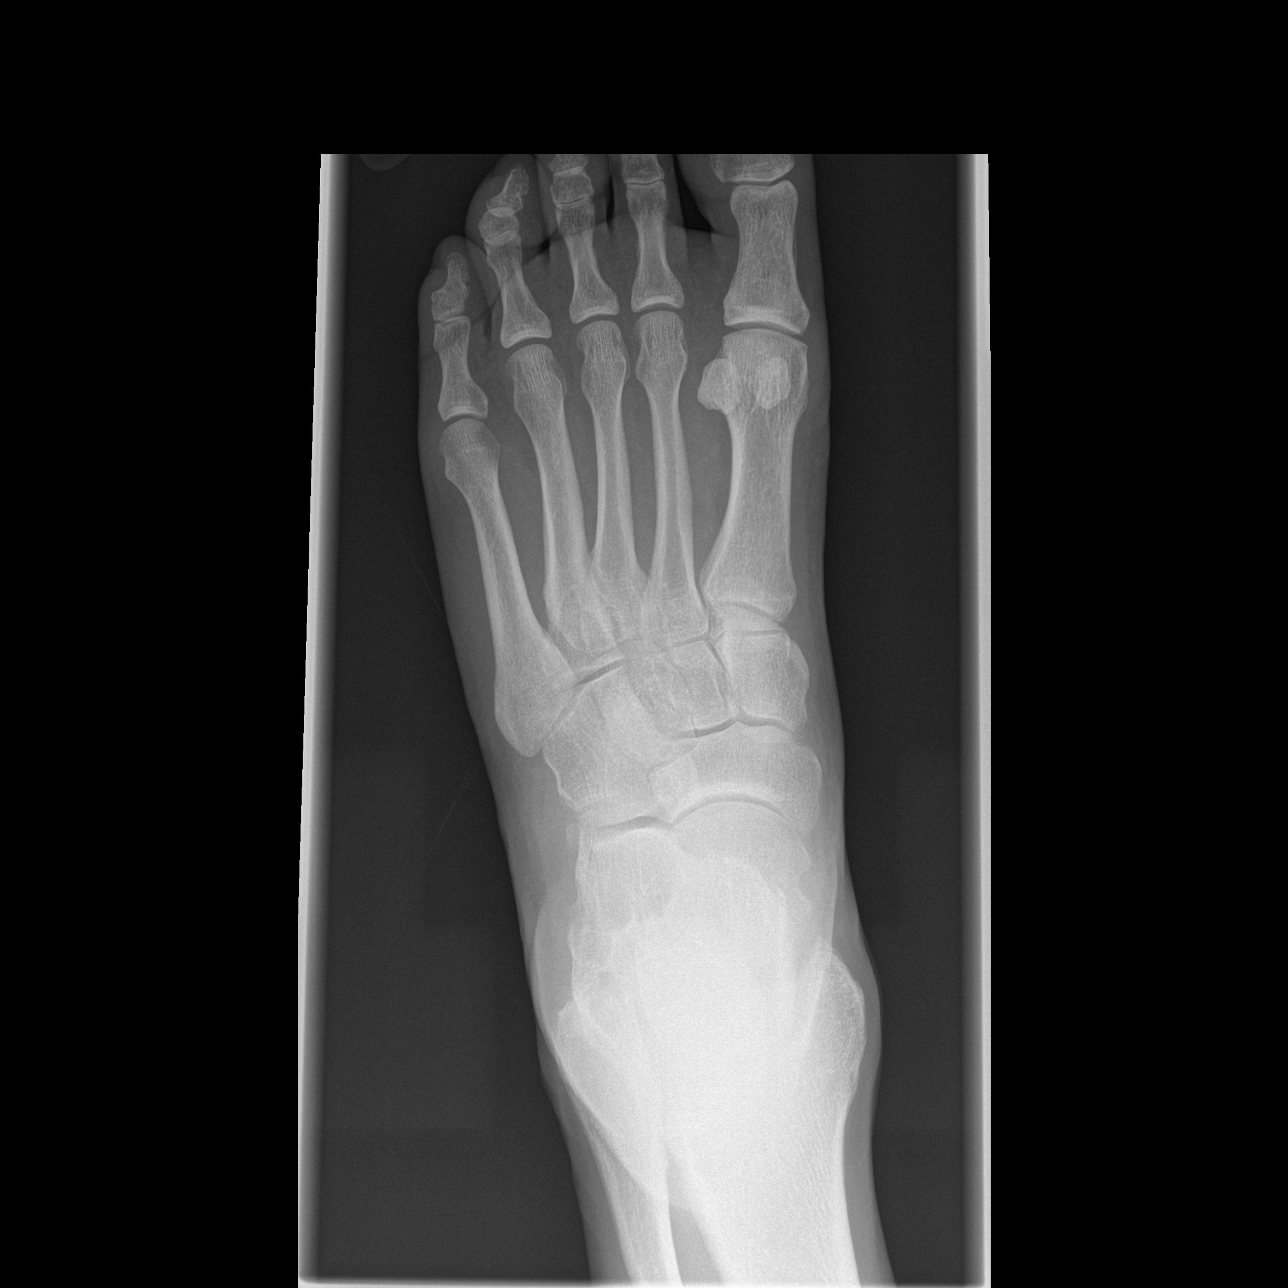

[t foot oblique left]
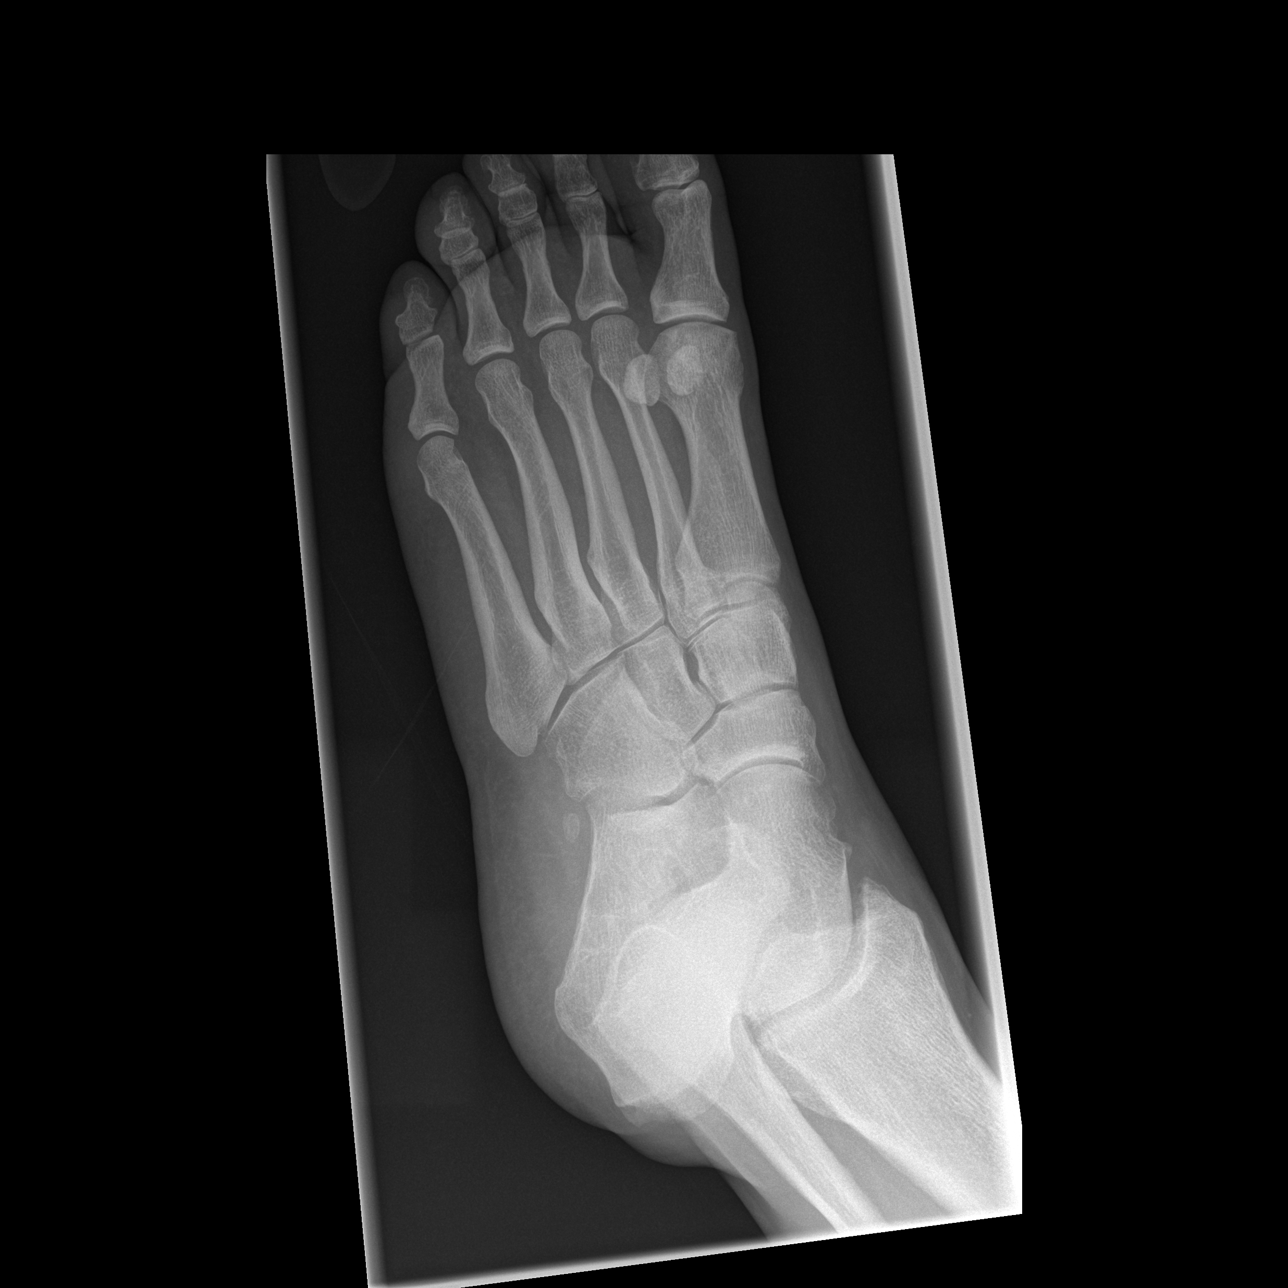

[t foot lat left]
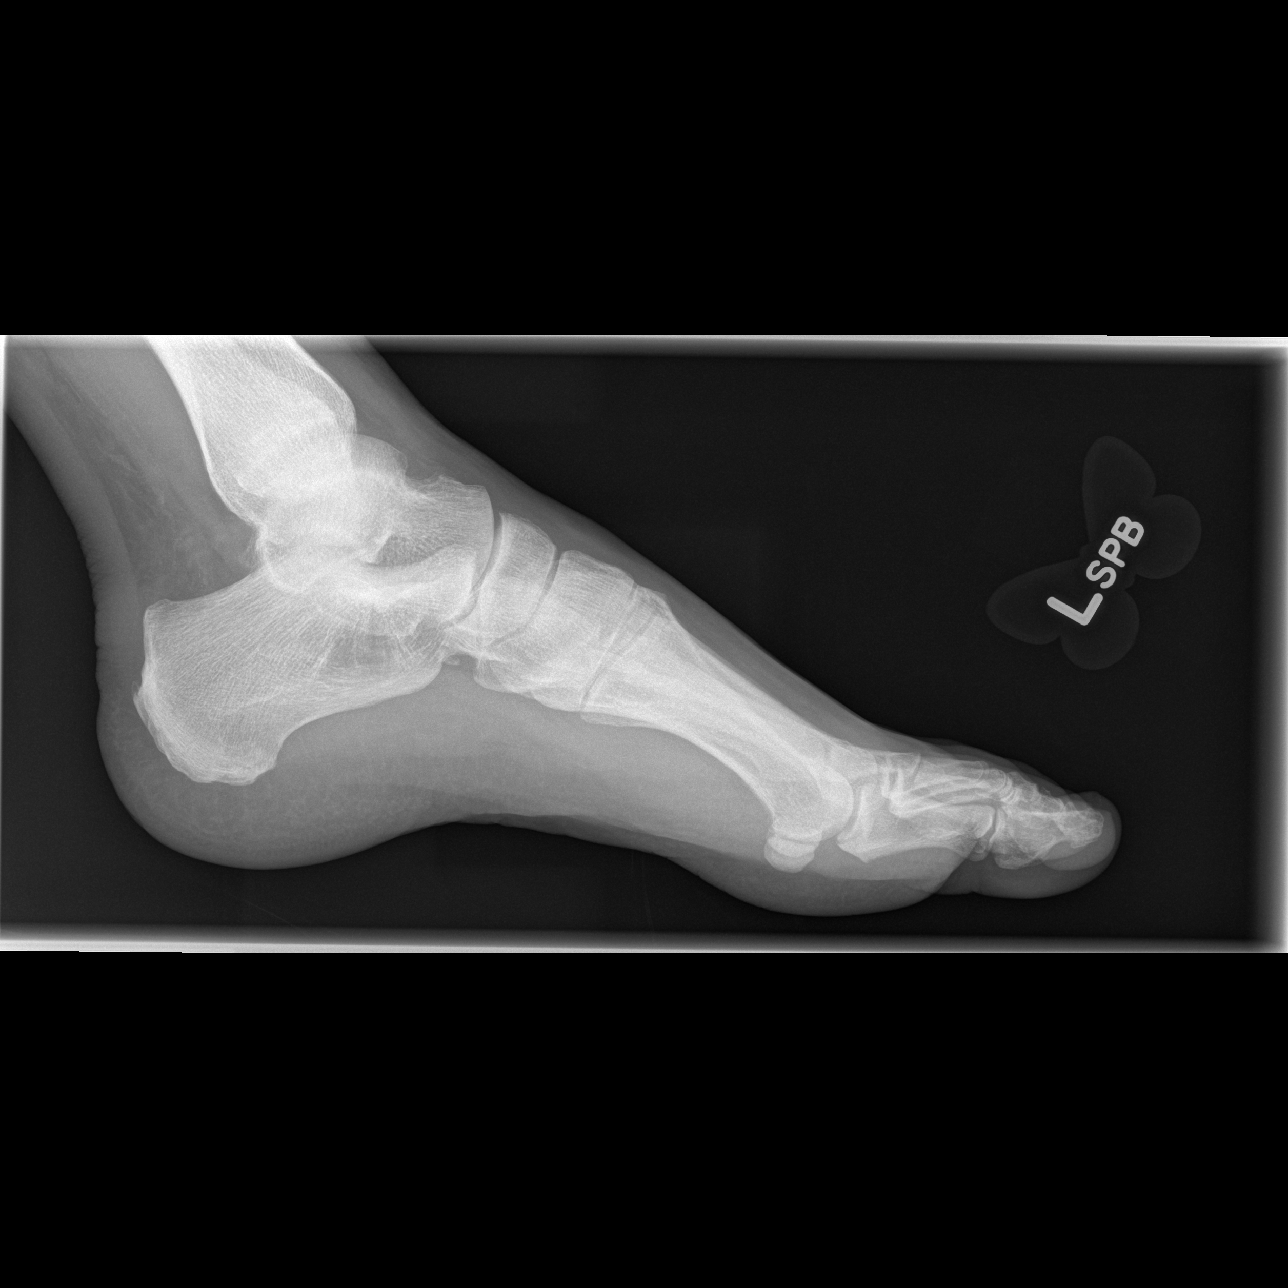

[3 of 3 positions shown; findings below may reference images not displayed]

FINDINGS: There is no evidence of fracture or dislocation. There is minimal
osteoarthritis of the first MTP joint. Soft tissues are
unremarkable.
IMPRESSION: No acute osseous injury of the left foot.

## 2016-10-24 IMAGING — DX DG ANKLE COMPLETE 3+V*R*
3 series · 3 of 3 positions shown · non-contrast
Comparison: None.

CLINICAL DATA: Pain following twisting injury 2 days prior

EXAM:
RIGHT ANKLE - COMPLETE 3+ VIEW

[x ankle ap right]
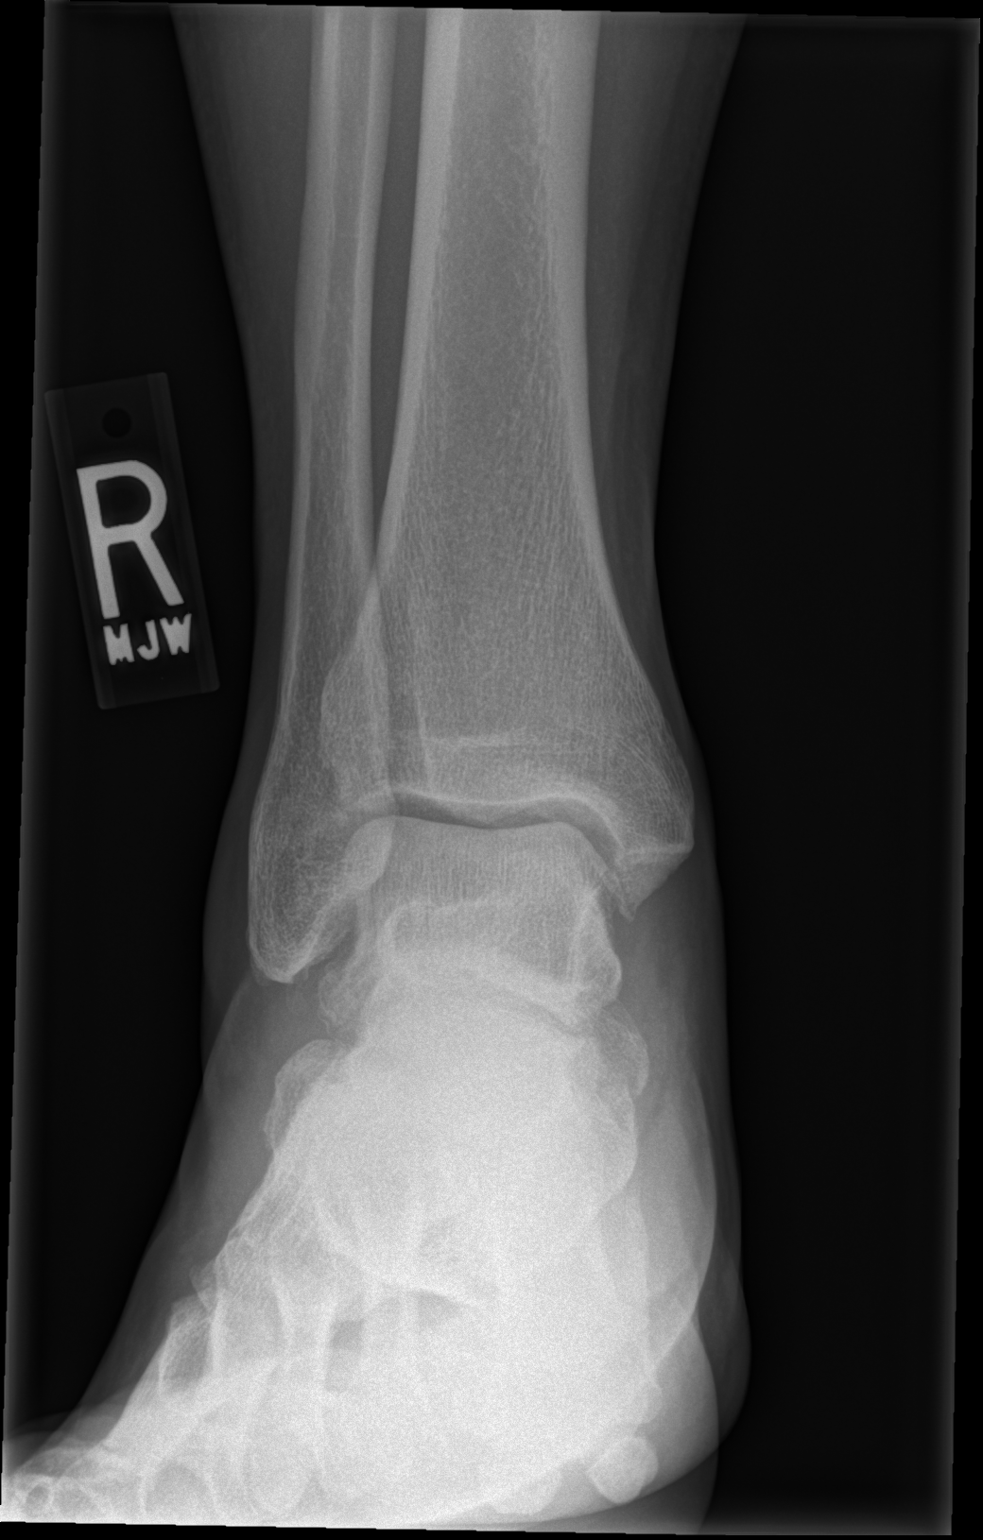

[x ankle obl right]
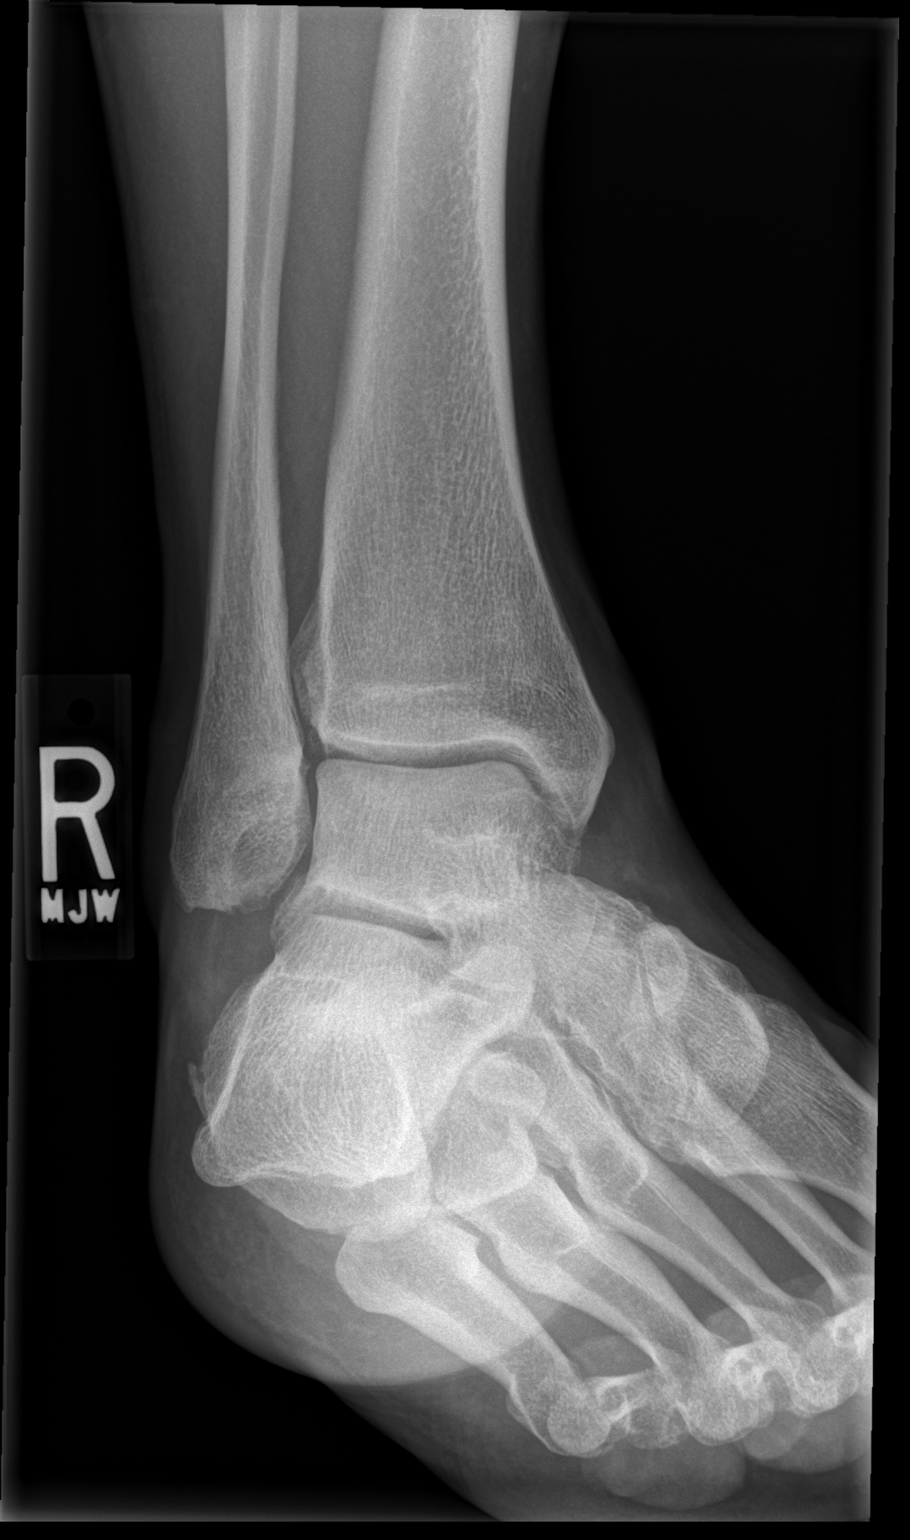

[x ankle lat right]
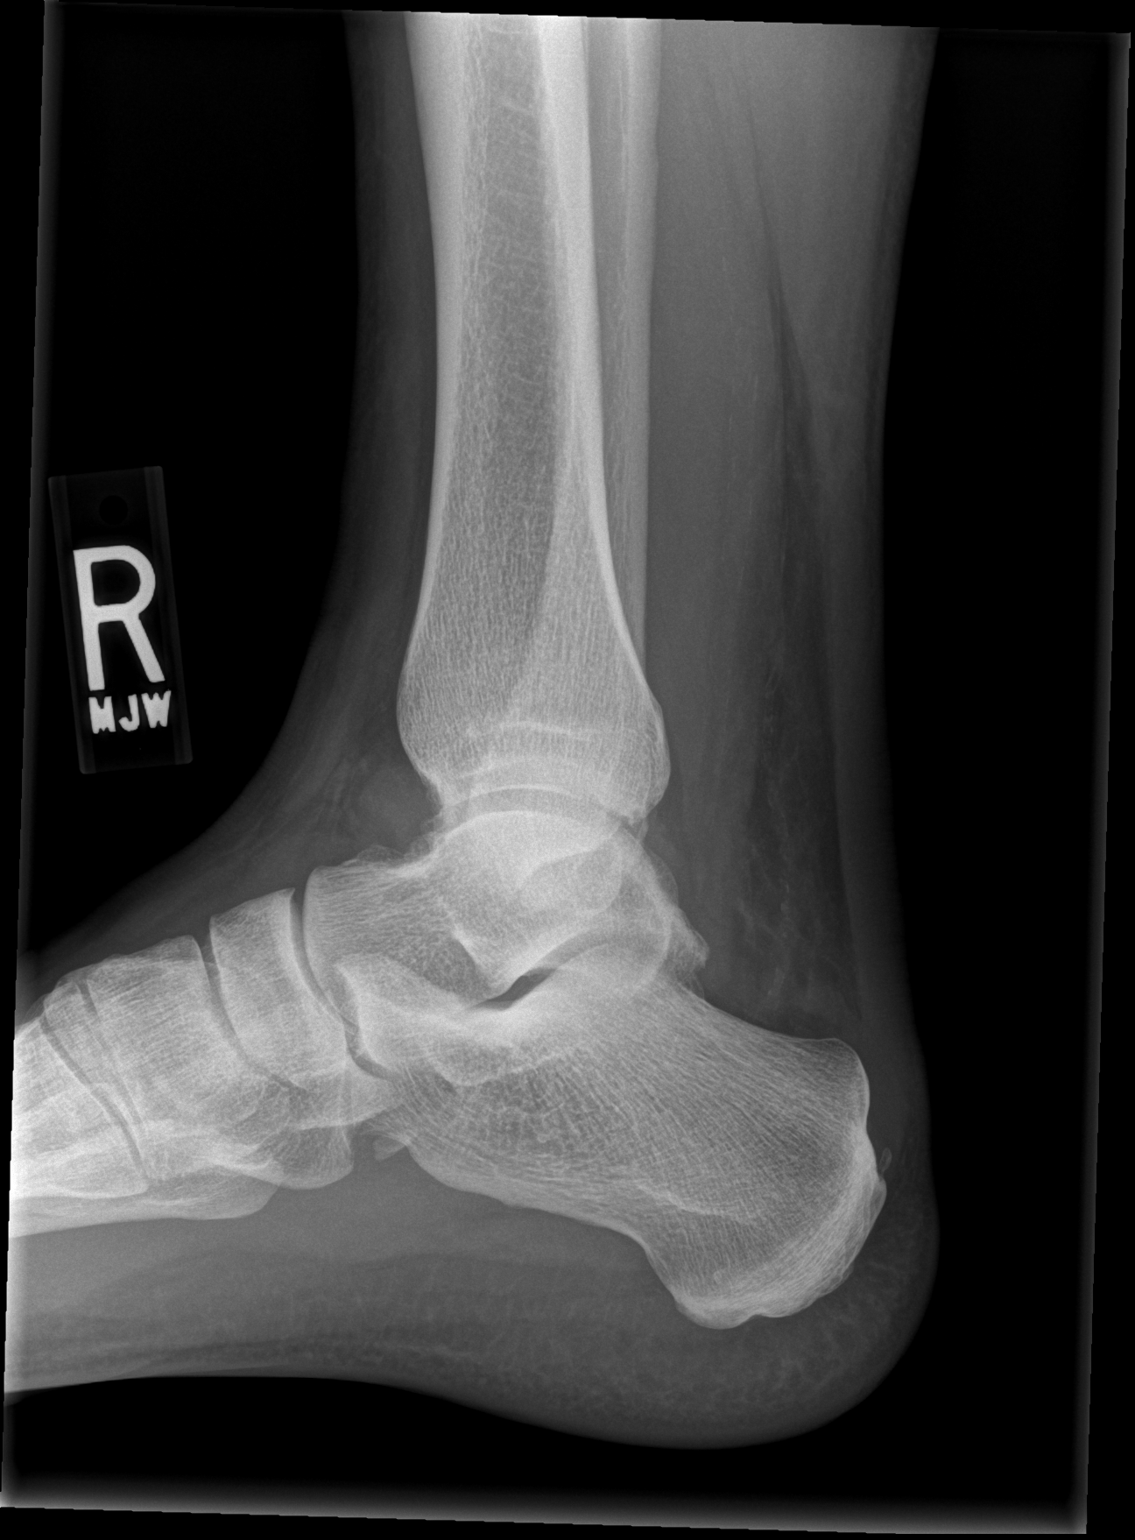

[3 of 3 positions shown; findings below may reference images not displayed]

FINDINGS: Frontal, oblique, and lateral views were obtained. There is soft
tissue swelling laterally with joint effusion. No fracture is
apparent. The ankle mortise appears intact. There is joint space
narrowing medially. There is a minimal spur arising from the
posterior calcaneus.
IMPRESSION: Soft tissue swelling with joint effusion. Question a degree of
underlying ligamentous injury. No fracture apparent. Ankle mortise
appears intact. Narrowing in the medial aspect of the joint.

## 2019-07-30 ENCOUNTER — Emergency Department
Admission: EM | Admit: 2019-07-30 | Discharge: 2019-07-30 | Disposition: A | Discharge status: Home / Self Care | Payer: BLUE CROSS/BLUE SHIELD | Source: Home / Self Care

## 2019-07-30 ENCOUNTER — Encounter: Payer: Self-pay | Admitting: Family Medicine

## 2019-07-30 ENCOUNTER — Other Ambulatory Visit: Payer: Self-pay

## 2019-07-30 DIAGNOSIS — M109 Gout, unspecified: Secondary | ICD-10-CM

## 2019-07-30 MED ORDER — COLCHICINE 0.6 MG PO TABS: ORAL_TABLET | ORAL | 5 refills | Status: AC

## 2019-07-30 NOTE — ED Provider Notes (Signed)
[facility]    CSN: [id number] Arrival date & time: 07/30/19  1442      History   Chief Complaint Chief Complaint  Patient presents with  . Gout    HPI John Haney is a 55 y.o. male.   Initial [facility] visit for this 54 yo man complaining of gout.  C/o pain to RLE since Friday - gout pain - chronic for years on & off  This episode began 3 days ago in the left knee and has moved into the right ankle.  Colchicine works very well for him  Patient is an Chief Financial Officer for [employer].     Past Medical History:  Diagnosis Date  . Medical history non-contributory     There are no problems to display for this patient.   Past Surgical History:  Procedure Laterality Date  . APPENDECTOMY  1984  . KNEE ARTHROSCOPY  2002   right  . KNEE ARTHROSCOPY Right 01/03/2013   Procedure: ARTHROSCOPY KNEE, CHONDROPLASTY AND EXCISION OF LOOSE BODY;  Surgeon: Kerin Salen, MD;  Location: [facility];  Service: Orthopedics;  Laterality: Right;  . KNEE SURGERY    . SHOULDER ARTHROSCOPY  05,13   right  . SHOULDER ARTHROSCOPY  2004   left  . SHOULDER SURGERY         Home Medications    Prior to Admission medications   Medication Sig Start Date End Date Taking? Authorizing Provider  allopurinol (ZYLOPRIM) 100 MG tablet Take by mouth. 11/09/18  Yes [provider]  colchicine 0.6 MG tablet Take two with food initially, then 1 bid prn 07/30/19   Robyn Haber, MD  ibuprofen (ADVIL,MOTRIN) 800 MG tablet Take 1 tablet (800 mg total) by mouth 3 (three) times daily. 03/13/15   Hedges, Dellis Filbert, PA-C  indomethacin (INDOCIN) 25 MG capsule Take 1 capsule (25 mg total) by mouth 3 (three) times daily with meals. May take up to 50mg  three times a day if no improvement with 25mg . 05/27/14   Piepenbrink, Anderson Malta, PA-C    Family History History reviewed. No pertinent family history.  Social History Social History   Tobacco Use  . Smoking status: Never Smoker  .  Smokeless tobacco: Current User    Types: Chew  Substance Use Topics  . Alcohol use: Never    Comment: RARE  . Drug use: No     Allergies   Patient has no known allergies.   Review of Systems Review of Systems  Constitutional: Negative.   Musculoskeletal: Positive for gait problem and joint swelling.     Physical Exam Triage Vital Signs ED Triage Vitals  Enc Vitals Group     BP      Pulse      Resp      Temp      Temp src      SpO2      Weight      Height      Head Circumference      Peak Flow      Pain Score      Pain Loc      Pain Edu?      Excl. in [place]?    No data found.  Updated Vital Signs BP 138/90 (BP Location: Right Arm)   Pulse (!) 131   Temp 99.6 F (37.6 C) (Oral)   Resp 18   Ht 5\' 10"  (1.778 m)   Wt 102.1 kg   SpO2 97%   BMI 32.28 kg/m  Physical Exam Vitals and nursing note reviewed.  Constitutional:      General: He is not in acute distress.    Appearance: Normal appearance. He is normal weight. He is not ill-appearing or toxic-appearing.  Eyes:     Conjunctiva/sclera: Conjunctivae normal.  Pulmonary:     Effort: Pulmonary effort is normal.  Musculoskeletal:        General: Swelling and tenderness present. No signs of injury.     Cervical back: Normal range of motion and neck supple.     Comments: Mildly swollen, tender right ankle  Skin:    General: Skin is warm and dry.  Neurological:     General: No focal deficit present.     Mental Status: He is alert.  Psychiatric:        Mood and Affect: Mood normal.        Behavior: Behavior normal.        Thought Content: Thought content normal.        Judgment: Judgment normal.      UC Treatments / Results  Labs (all labs ordered are listed, but only abnormal results are displayed) Labs Reviewed - No data to display  EKG   Radiology No results found.  Procedures Procedures (including critical care time)  Medications Ordered in UC Medications - No data to  display  Initial Impression / Assessment and Plan / UC Course  I have reviewed the triage vital signs and the nursing notes.  Pertinent labs & imaging results that were available during my care of the patient were reviewed by me and considered in my medical decision making (see chart for details).    Final Clinical Impressions(s) / UC Diagnoses   Final diagnoses:  Acute gout of right ankle, unspecified cause   Discharge Instructions   None    ED Prescriptions    Medication Sig Dispense Auth. Provider   colchicine 0.6 MG tablet Take two with food initially, then 1 bid prn 30 tablet Robyn Haber, MD     I have reviewed the PDMP during this encounter.   Robyn Haber, MD 07/30/19 [phone]

## 2019-07-30 NOTE — ED Triage Notes (Signed)
C/o pain to RLE since Friday - gout pain - chronic for years on & off
